# Patient Record
Sex: Female | Born: 1970 | Race: Black or African American | Hispanic: No | Marital: Single | Smoking: Never smoker
Health system: Southern US, Community
[De-identification: ages and names within clinical notes are randomized; demographics above are authoritative.]

---

## 2013-01-24 ENCOUNTER — Emergency Department (HOSPITAL_COMMUNITY)
Admission: EM | Admit: 2013-01-24 | Discharge: 2013-01-24 | Disposition: A | Discharge status: Home / Self Care | Payer: Medicaid Other | Attending: Emergency Medicine | Admitting: Emergency Medicine

## 2013-01-24 ENCOUNTER — Encounter (HOSPITAL_COMMUNITY): Payer: Self-pay | Admitting: Family Medicine

## 2013-01-24 ENCOUNTER — Emergency Department (HOSPITAL_COMMUNITY): Payer: Medicaid Other

## 2013-01-24 DIAGNOSIS — M549 Dorsalgia, unspecified: Secondary | ICD-10-CM | POA: Insufficient documentation

## 2013-01-24 DIAGNOSIS — M542 Cervicalgia: Secondary | ICD-10-CM | POA: Insufficient documentation

## 2013-01-24 DIAGNOSIS — R079 Chest pain, unspecified: Secondary | ICD-10-CM

## 2013-01-24 LAB — POCT I-STAT, CHEM 8
BUN: 10 mg/dL (ref 6–23)
Chloride: 103 mEq/L (ref 96–112)
Creatinine, Ser: 1.1 mg/dL (ref 0.50–1.10)
Potassium: 3.5 mEq/L (ref 3.5–5.1)
Sodium: 138 mEq/L (ref 135–145)

## 2013-01-24 LAB — CBC WITH DIFFERENTIAL/PLATELET
Lymphocytes Relative: 23 % (ref 12–46)
Lymphs Abs: 1.6 10*3/uL (ref 0.7–4.0)
MCV: 81.6 fL (ref 78.0–100.0)
Neutro Abs: 4.7 10*3/uL (ref 1.7–7.7)
Neutrophils Relative %: 69 % (ref 43–77)
Platelets: 262 10*3/uL (ref 150–400)
RBC: 4.51 MIL/uL (ref 3.87–5.11)
WBC: 6.9 10*3/uL (ref 4.0–10.5)

## 2013-01-24 LAB — POCT I-STAT TROPONIN I: Troponin i, poc: 0 ng/mL (ref 0.00–0.08)

## 2013-01-24 LAB — D-DIMER, QUANTITATIVE: D-Dimer, Quant: <0.27 ug{FEU}/mL (ref 0.00–0.48)

## 2013-01-24 NOTE — ED Notes (Signed)
Per pt sts intermittent right sided neck pain, chest pain and upper back pain x 1 month. sts the pain is sharp under her breast. Denies SOB, N, diaphoresis, dizzness.

## 2013-01-24 NOTE — ED Notes (Signed)
NAD noted at time of d/c home 

## 2013-01-24 NOTE — ED Provider Notes (Addendum)
History     CSN: [id number]  Arrival date & time 01/24/13  1436   First MD Initiated Contact with Patient 01/24/13 1457      Chief Complaint  Patient presents with  . Neck Pain  . Chest Pain    (Consider location/radiation/quality/duration/timing/severity/associated sxs/prior treatment) HPI Comments: Patient presents with intermittent pain to her right lateral chest. She describes as a sharp pain that comes and goes. It's been intermittent for the last month. It typically lasts about a minute and subsided. She describes as a sharp pain to her right chest in the mid axilla area which at times radiates behind her scapula. At times she feels some tightness in the center of her chest. Again this typically lasts less than then goes away. It mostly occurs at rest. She has no symptoms on exertion. She has no shortness of breath nausea diaphoresis or dizziness. She denies any leg pain or swelling. She denies any pleuritic-type pain. She denies any VTE risk factors. She denies any history of heart problems in the past. She denies a family history of early heart disease. She also has some tenderness over her right trapezius muscle. She feels like it's stress-induced and tense. She denies any other neck pain. She denies any numbness or weakness in her hands.  Patient is a 42 y.o. female presenting with neck pain and chest pain.  Neck Pain Associated symptoms: chest pain   Associated symptoms: no fever, no headaches, no numbness and no weakness   Chest Pain Associated symptoms: back pain   Associated symptoms: no abdominal pain, no cough, no diaphoresis, no dizziness, no fatigue, no fever, no headache, no nausea, no numbness, no shortness of breath, not vomiting and no weakness     History reviewed. No pertinent past medical history.  Past Surgical History  Procedure Laterality Date  . Cesarean section      History reviewed. No pertinent family history.  History  Substance Use Topics  .  Smoking status: Never Smoker   . Smokeless tobacco: Not on file  . Alcohol Use: No    OB History   Grav Para Term Preterm Abortions TAB SAB Ect Mult Living                  Review of Systems  Constitutional: Negative for fever, chills, diaphoresis and fatigue.  HENT: Positive for neck pain. Negative for congestion, rhinorrhea and sneezing.   Eyes: Negative.   Respiratory: Negative for cough, chest tightness and shortness of breath.   Cardiovascular: Positive for chest pain. Negative for leg swelling.  Gastrointestinal: Negative for nausea, vomiting, abdominal pain, diarrhea and blood in stool.  Genitourinary: Negative for frequency, hematuria, flank pain and difficulty urinating.  Musculoskeletal: Positive for back pain. Negative for arthralgias.  Skin: Negative for rash.  Neurological: Negative for dizziness, speech difficulty, weakness, numbness and headaches.    Allergies  Review of patient's allergies indicates no known allergies.  Home Medications  No current outpatient prescriptions on file.  BP 129/72  Pulse 80  Temp(Src) 98.3 F (36.8 C)  Resp 18  SpO2 100%  LMP 01/18/2013  Physical Exam  Constitutional: She is oriented to person, place, and time. She appears well-developed and well-nourished.  HENT:  Head: Normocephalic and atraumatic.  Eyes: Pupils are equal, round, and reactive to light.  Neck: Normal range of motion. Neck supple.  Cardiovascular: Normal rate, regular rhythm and normal heart sounds.   Pulmonary/Chest: Effort normal and breath sounds normal. No respiratory distress. She has  no wheezes. She has no rales. She exhibits no tenderness.  Abdominal: Soft. Bowel sounds are normal. There is no tenderness. There is no rebound and no guarding.  Musculoskeletal: Normal range of motion. She exhibits no edema.  Lymphadenopathy:    She has no cervical adenopathy.  Neurological: She is alert and oriented to person, place, and time.  Skin: Skin is warm and  dry. No rash noted.  Psychiatric: She has a normal mood and affect.    ED Course  Procedures (including critical care time)  Results for orders placed during the hospital encounter of 01/24/13  D-DIMER, QUANTITATIVE      Result Value Range   D-Dimer, Quant <0.27  0.00 - 0.48 ug/mL-FEU  CBC WITH DIFFERENTIAL      Result Value Range   WBC 6.9  4.0 - 10.5 K/uL   RBC 4.51  3.87 - 5.11 MIL/uL   Hemoglobin 12.8  12.0 - 15.0 g/dL   HCT 16.1  09.6 - 04.5 %   MCV 81.6  78.0 - 100.0 fL   MCH 28.4  26.0 - 34.0 pg   MCHC 34.8  30.0 - 36.0 g/dL   RDW 40.9  81.1 - 91.4 %   Platelets 262  150 - 400 K/uL   Neutrophils Relative 69  43 - 77 %   Neutro Abs 4.7  1.7 - 7.7 K/uL   Lymphocytes Relative 23  12 - 46 %   Lymphs Abs 1.6  0.7 - 4.0 K/uL   Monocytes Relative 7  3 - 12 %   Monocytes Absolute 0.5  0.1 - 1.0 K/uL   Eosinophils Relative 1  0 - 5 %   Eosinophils Absolute 0.1  0.0 - 0.7 K/uL   Basophils Relative 0  0 - 1 %   Basophils Absolute 0.0  0.0 - 0.1 K/uL  POCT I-STAT, CHEM 8      Result Value Range   Sodium 138  135 - 145 mEq/L   Potassium 3.5  3.5 - 5.1 mEq/L   Chloride 103  96 - 112 mEq/L   BUN 10  6 - 23 mg/dL   Creatinine, Ser 7.82  0.50 - 1.10 mg/dL   Glucose, Bld 84  70 - 99 mg/dL   Calcium, Ion 9.56  2.13 - 1.23 mmol/L   TCO2 26  0 - 100 mmol/L   Hemoglobin 13.6  12.0 - 15.0 g/dL   HCT 08.6  57.8 - 46.9 %  POCT I-STAT TROPONIN I      Result Value Range   Troponin i, poc 0.00  0.00 - 0.08 ng/mL   Comment 3            Dg Chest 2 View  01/24/2013  *RADIOLOGY REPORT*  Clinical Data: Chest pain  CHEST - 2 VIEW  Comparison: None.  Findings: Cardiomediastinal silhouette appears normal.  No acute pulmonary disease is noted.  Bony thorax is intact.  IMPRESSION: No acute cardiopulmonary abnormality seen.   Original Report Authenticated By: Lupita Raider.,  M.D.       Date: 01/24/2013  Rate: 78  Rhythm: normal sinus rhythm  QRS Axis: normal  Intervals: normal  ST/T Wave  abnormalities: normal  Conduction Disutrbances:none  Narrative Interpretation:   Old EKG Reviewed: none available   1. Chest pain       MDM  Patient presents with intermittent sharp pains to her right side of her chest. She has no other associated symptoms. Her troponin is negative and her EKG  did not show ischemic changes. I have a low suspicion of acute coronary syndrome. There's nothing else suggestive of pulmonary embolus. I feel this is likely more musculoskeletal in nature. She doesn't have any reflux type symptoms. I gave her some possible resources for outpatient followup. I advised her the importance of close followup if her symptoms are continuing or return here as needed if her symptoms worsen.        Rolan Bucco, MD 01/24/13 1643  Rolan Bucco, MD 01/24/13 [phone]

## 2013-05-04 ENCOUNTER — Emergency Department (HOSPITAL_COMMUNITY)
Admission: EM | Admit: 2013-05-04 | Discharge: 2013-05-05 | Disposition: A | Discharge status: Home / Self Care | Payer: Medicaid Other | Attending: Emergency Medicine | Admitting: Emergency Medicine

## 2013-05-04 ENCOUNTER — Encounter (HOSPITAL_COMMUNITY): Payer: Self-pay | Admitting: *Deleted

## 2013-05-04 DIAGNOSIS — H53149 Visual discomfort, unspecified: Secondary | ICD-10-CM | POA: Insufficient documentation

## 2013-05-04 DIAGNOSIS — H11431 Conjunctival hyperemia, right eye: Secondary | ICD-10-CM

## 2013-05-04 DIAGNOSIS — H109 Unspecified conjunctivitis: Secondary | ICD-10-CM | POA: Insufficient documentation

## 2013-05-04 MED ORDER — TETRACAINE HCL 0.5 % OP SOLN
2.0000 [drp] | Freq: Once | OPHTHALMIC | Status: DC
  Filled 2013-05-04: qty 2

## 2013-05-04 MED ORDER — CIPROFLOXACIN HCL 0.3 % OP SOLN: 1.0000 [drp] | OPHTHALMIC | Status: DC

## 2013-05-04 MED ORDER — FLUORESCEIN SODIUM 1 MG OP STRP
1.0000 | ORAL_STRIP | Freq: Once | OPHTHALMIC | Status: DC
  Filled 2013-05-04: qty 1

## 2013-05-04 NOTE — ED Provider Notes (Signed)
CSN: [id number]     Arrival date & time 05/04/13  1921 History  This chart was scribed for non-physician practitioner, Dierdre Forth, PA-C working with Dagmar Hait, MD by Greggory Stallion, ED scribe. This patient was seen in room TR04C/TR04C and the patient's care was started at 10:00 PM.   Chief Complaint  Patient presents with  . Eye Problem   The history is provided by the patient. No language interpreter was used.    HPI Comments: Jill Hensley is a 42 y.o. female who presents to the Emergency Department complaining of gradual onset, constant right eye redness and irritation with associated photophobia that started yesterday. Pt denies blurred vision, but feels as if she has an eyelash in her eye. She has tried redness instant relief eye drops with no relief. Pt denies eye itching as associated symptoms. She wears contacts on a daily basis.  This pair she has had in for 1.5 weeks and she has not changed them. She denies fever, chills, headache, neck pain, neck stiffness, swelling of her eye or other associated symptoms.    History reviewed. No pertinent past medical history. Past Surgical History  Procedure Laterality Date  . Cesarean section     No family history on file. History  Substance Use Topics  . Smoking status: Never Smoker   . Smokeless tobacco: Not on file  . Alcohol Use: No   OB History   Grav Para Term Preterm Abortions TAB SAB Ect Mult Living                 Review of Systems  Constitutional: Negative for fever, diaphoresis, appetite change, fatigue and unexpected weight change.  HENT: Negative for mouth sores and neck stiffness.   Eyes: Positive for photophobia and redness. Negative for itching and visual disturbance.  Respiratory: Negative for cough, chest tightness, shortness of breath and wheezing.   Cardiovascular: Negative for chest pain.  Gastrointestinal: Negative for nausea, vomiting, abdominal pain, diarrhea and constipation.  Endocrine:  Negative for polydipsia, polyphagia and polyuria.  Genitourinary: Negative for dysuria, urgency, frequency and hematuria.  Musculoskeletal: Negative for back pain.  Skin: Negative for rash.  Allergic/Immunologic: Negative for immunocompromised state.  Neurological: Negative for syncope, light-headedness and headaches.  Hematological: Does not bruise/bleed easily.  Psychiatric/Behavioral: Negative for sleep disturbance. The patient is not nervous/anxious.   All other systems reviewed and are negative.    Allergies  Review of patient's allergies indicates no known allergies.  Home Medications   Current Outpatient Rx  Name  Route  Sig  Dispense  Refill  . naphazoline-glycerin (CLEAR EYES) 0.012-0.2 % SOLN   Right Eye   Place 1-2 drops into the right eye every 3 (three) hours as needed (eye irritation).         . ciprofloxacin (CILOXAN) 0.3 % ophthalmic solution   Right Eye   Place 1 drop into the right eye every 2 (two) hours. Administer 1 drop, every 2 hours, while awake, for 2 days. Then 1 drop, every 4 hours, while awake, for the next 5 days.   5 mL   0     BP 117/73  Pulse 81  Temp(Src) 98.2 F (36.8 C)  Resp 18  SpO2 98%  Physical Exam  Nursing note and vitals reviewed. Constitutional: She appears well-developed and well-nourished. No distress.  Awake, alert, nontoxic appearance  HENT:  Head: Normocephalic and atraumatic.  Right Ear: Hearing, tympanic membrane, external ear and ear canal normal.  Left Ear: Hearing, tympanic membrane,  external ear and ear canal normal.  Nose: Nose normal. No mucosal edema or rhinorrhea.  Mouth/Throat: Uvula is midline, oropharynx is clear and moist and mucous membranes are normal. Mucous membranes are not dry. No oropharyngeal exudate, posterior oropharyngeal edema, posterior oropharyngeal erythema or tonsillar abscesses.  Eyes: EOM and lids are normal. Pupils are equal, round, and reactive to light. No foreign bodies found. Right  eye exhibits no chemosis, no discharge and no exudate. No foreign body present in the right eye. Left eye exhibits no chemosis, no discharge and no exudate. No foreign body present in the left eye. Right conjunctiva is injected. Right conjunctiva has no hemorrhage. Left conjunctiva is not injected. Left conjunctiva has no hemorrhage. No scleral icterus. Right eye exhibits normal extraocular motion. Left eye exhibits normal extraocular motion.  Fundoscopic exam:      The right eye shows no arteriolar narrowing, no AV nicking, no exudate, no hemorrhage and no papilledema.       The left eye shows no arteriolar narrowing, no AV nicking, no exudate, no hemorrhage and no papilledema.  Slit lamp exam:      The right eye shows no corneal abrasion, no corneal flare, no corneal ulcer, no foreign body, no hyphema, no fluorescein uptake and no anterior chamber bulge.       The left eye shows no corneal abrasion, no corneal flare, no corneal ulcer, no foreign body, no hyphema, no fluorescein uptake and no anterior chamber bulge.  Visual Acuity  -   Bilateral Distance:  20/50 ;  R Distance:  20/50 ;  L Distance:  20/50  No corneal abrasion or ulcer noted on slit-lamp exam no fluorescein uptake of the right eye No tenderness to palpation of the right eye  Neck: Normal range of motion. Neck supple.  Cardiovascular: Normal rate, regular rhythm and intact distal pulses.   Pulmonary/Chest: Effort normal and breath sounds normal. No respiratory distress. She has no wheezes.  Abdominal: Soft. Bowel sounds are normal. She exhibits no mass. There is no tenderness. There is no rebound and no guarding.  Musculoskeletal: Normal range of motion. She exhibits no edema.  Neurological: She is alert.  Speech is clear and goal oriented Moves extremities without ataxia  Skin: Skin is warm and dry. She is not diaphoretic.  Psychiatric: She has a normal mood and affect.    ED Course   Procedures (including critical care  time)  DIAGNOSTIC STUDIES: Oxygen Saturation is 98% on RA, normal by my interpretation.    COORDINATION OF CARE: 11:27 PM-Discussed treatment plan which includes talking with Dr. Gwendolyn Grant with pt at bedside and pt agreed to plan. Advised pt to follow up with an opthalmologist.  Labs Reviewed - No data to display No results found. 1. Photophobia   2. Conjunctival injection, right     MDM  Wilson Singer presents with photophobia and eye redness.  Patient presentation consistent with viral conjunctivitis, but concern for infection exists.  No purulent discharge, corneal abrasions, entrapment, consensual photophobia, or dendritic staining with fluorescein study.  Presentation non-concerning for iritis, bacterial conjunctivitis, corneal abrasions, or HSV.  Pt will be d/c with cipro opthalmic for coverage as she is a contact lens wearer.  Personal hygiene and frequent handwashing discussed.  Patient advised to followup with ophthalmologist tomorrow for re-evaluation.  Patient verbalizes understanding and is agreeable with discharge.  I have also discussed reasons to return immediately to the ER.  Patient expresses understanding and agrees with plan.  I personally performed the  services described in this documentation, which was scribed in my presence. The recorded information has been reviewed and is accurate.   Dahlia Client Mabry Tift, PA-C 05/04/13 2355

## 2013-05-04 NOTE — ED Notes (Signed)
Pt states she noticed her rt eye has began to get more red over the past 12hrs. Pt states right now she feels like it may be a lash in her eye and has been rubbing it but her eye does not itch. Pt wears contacts, has them in now. Pt states she wears her contacts for about two months before changing them out. Pts rt eye is visibly red. Pt denies any pain to the eye but states she does have light sensitivity.

## 2013-05-04 NOTE — ED Notes (Signed)
The pt has had a red irritated rt eye since yesterday.  No itching she has blurred  Vision.  She wears contacts and she is wearing

## 2013-05-05 MED ORDER — CIPROFLOXACIN HCL 0.3 % OP SOLN: 1.0000 [drp] | OPHTHALMIC | Status: DC

## 2013-05-05 NOTE — ED Provider Notes (Signed)
Medical screening examination/treatment/procedure(s) were performed by non-physician practitioner and as supervising physician I was immediately available for consultation/collaboration.  Brittnie Lewey Smitty Cords, MD 05/05/13 0010

## 2013-09-07 ENCOUNTER — Emergency Department (HOSPITAL_COMMUNITY)
Admission: EM | Admit: 2013-09-07 | Discharge: 2013-09-08 | Disposition: A | Discharge status: Home / Self Care | Payer: Medicaid Other | Attending: Emergency Medicine | Admitting: Emergency Medicine

## 2013-09-07 ENCOUNTER — Encounter (HOSPITAL_COMMUNITY): Payer: Self-pay | Admitting: Emergency Medicine

## 2013-09-07 DIAGNOSIS — R209 Unspecified disturbances of skin sensation: Secondary | ICD-10-CM | POA: Insufficient documentation

## 2013-09-07 DIAGNOSIS — H9209 Otalgia, unspecified ear: Secondary | ICD-10-CM | POA: Insufficient documentation

## 2013-09-07 DIAGNOSIS — X58XXXA Exposure to other specified factors, initial encounter: Secondary | ICD-10-CM | POA: Insufficient documentation

## 2013-09-07 DIAGNOSIS — R509 Fever, unspecified: Secondary | ICD-10-CM | POA: Insufficient documentation

## 2013-09-07 DIAGNOSIS — Z79899 Other long term (current) drug therapy: Secondary | ICD-10-CM | POA: Insufficient documentation

## 2013-09-07 DIAGNOSIS — S161XXA Strain of muscle, fascia and tendon at neck level, initial encounter: Secondary | ICD-10-CM

## 2013-09-07 DIAGNOSIS — S139XXA Sprain of joints and ligaments of unspecified parts of neck, initial encounter: Secondary | ICD-10-CM | POA: Insufficient documentation

## 2013-09-07 DIAGNOSIS — Y939 Activity, unspecified: Secondary | ICD-10-CM | POA: Insufficient documentation

## 2013-09-07 DIAGNOSIS — R51 Headache: Secondary | ICD-10-CM | POA: Insufficient documentation

## 2013-09-07 DIAGNOSIS — Y929 Unspecified place or not applicable: Secondary | ICD-10-CM | POA: Insufficient documentation

## 2013-09-07 MED ORDER — MELOXICAM 7.5 MG PO TABS: 15.0000 mg | ORAL_TABLET | Freq: Every day | ORAL | Status: DC

## 2013-09-07 MED ORDER — METHOCARBAMOL 500 MG PO TABS: 500.0000 mg | ORAL_TABLET | Freq: Two times a day (BID) | ORAL | Status: AC

## 2013-09-07 MED ORDER — METHOCARBAMOL 500 MG PO TABS: 500.0000 mg | ORAL_TABLET | Freq: Two times a day (BID) | ORAL | Status: DC

## 2013-09-07 NOTE — ED Notes (Signed)
Pt states she has been having a stiff neck for roughly one week, and states she had a fever previously and has been taking Aleve for the fever and pain.

## 2013-09-07 NOTE — ED Notes (Signed)
Pt c/o pain in right side of neck with stiffness.  Also c/o headache off and on.  Fever 2 days ago.

## 2013-09-07 NOTE — ED Provider Notes (Signed)
CSN: [id number]     Arrival date & time 09/07/13  2151 History  This chart was scribed for non-physician practitioner Antony Madura, PA-C working with Sunnie Nielsen, MD by Joaquin Music, ED Scribe. This patient was seen in room TR04C/TR04C and the patient's care was started at 11:30 PM .   Chief Complaint  Patient presents with  . Torticollis   HPI HPI Comments: Jill Hensley is a 42 y.o. female who presents to the Emergency Department complaining of constant neck pain for the past week. She states her neck pain when she turns her head to the R. Pt reports having pressure and discomfort at the base of her head that can cause intermittent HA. She describes her HA as sharp, spontaneous, and fleeting; lasts seconds before spontaneously resolving. She states she has been taking OTC 12-hr Aleve and applying heat to area with relief. She also endorses a subjective tingling in her R arm that is intermittent. Pt denies any previous traumas/injuries to the neck. She states pain worsens when turning to the R and states it can feel stiff at times. Pt denies fever, vision change, difficulty swallowing, ringing of ears, and weakness.  Pt denies having a PCP at this moment but states she usually goes to [facility] to be seen. She states she has not been to [facility] in the past 6 months.   History reviewed. No pertinent past medical history. Past Surgical History  Procedure Laterality Date  . Cesarean section     No family history on file. History  Substance Use Topics  . Smoking status: Never Smoker   . Smokeless tobacco: Not on file  . Alcohol Use: No   OB History   Grav Para Term Preterm Abortions TAB SAB Ect Mult Living   3 3 3  0 0     3     Review of Systems  Constitutional: Positive for fever.  HENT: Positive for ear pain. Negative for trouble swallowing.   Musculoskeletal: Positive for myalgias, neck pain and neck stiffness.  Neurological: Negative for weakness.    Allergies  Review of  patient's allergies indicates no known allergies.  Home Medications   Current Outpatient Rx  Name  Route  Sig  Dispense  Refill  . KETOTIFEN FUMARATE OP   Both Eyes   Place 1 drop into both eyes 3 (three) times daily. 0.035%         . naproxen sodium (ANAPROX) 220 MG tablet   Oral   Take 220 mg by mouth daily as needed (for neck pains).         Bertram Gala Glycol-Propyl Glycol (SYSTANE) 0.4-0.3 % SOLN   Both Eyes   Place 1 drop into both eyes 3 (three) times daily.          Triage Vitals:BP 139/81  Pulse 85  Temp(Src) 97.9 F (36.6 C) (Oral)  Resp 16  SpO2 97%  LMP 09/03/2013  Physical Exam  Nursing note and vitals reviewed. Constitutional: She is oriented to person, place, and time. She appears well-developed and well-nourished. No distress.  HENT:  Head: Normocephalic and atraumatic.  Mouth/Throat: Oropharynx is clear and moist. No oropharyngeal exudate.  Eyes: Conjunctivae and EOM are normal. Pupils are equal, round, and reactive to light. No scleral icterus.  Neck: Normal range of motion. Neck supple.  Normal strength against resistance of neck. No midline TTP of cervical spine. R paraspinal muscle and R SCM TTP appreciated.  Pulmonary/Chest: Effort normal. No respiratory distress.  Musculoskeletal: Normal range of  motion.  Lymphadenopathy:    She has no cervical adenopathy.  Neurological: She is alert and oriented to person, place, and time.  No focal neurologic deficits appreciated. Patient moves extremities without ataxia. No sensory deficits appreciated.  Skin: Skin is warm and dry. No rash noted. She is not diaphoretic. No erythema. No pallor.  Psychiatric: She has a normal mood and affect. Her behavior is normal.    ED Course  Procedures DIAGNOSTIC STUDIES: Oxygen Saturation is 97% on RA, normal by my interpretation.    COORDINATION OF CARE: 11:40 PM-Discussed treatment plan which includes D/C pt with Mobic and Robaxin. Advised pt to apply ice to  the area. Pt agreed to plan.   Labs Review Labs Reviewed - No data to display Imaging Review No results found.  EKG Interpretation   None       MDM   1. Neck strain, initial encounter    Uncomplicated neck strain in this otherwise healthy 42 year old female. No focal neurologic deficits appreciated on physical exam. Patient moves extremities without ataxia. No nuchal rigidity or meningismus. Patient well and nontoxic appearing, hemodynamically stable, and afebrile. She denies trauma or injury to her neck as well as IV drug use or history of cancer. Patient stable and appropriate for discharge with prescriptions for Mobic and Robaxin. Return precautions discussed patient agreeable to plan with no unaddressed concerns.  I personally performed the services described in this documentation, which was scribed in my presence. The recorded information has been reviewed and is accurate.     Antony Madura, PA-C 09/10/13 [phone]

## 2013-09-17 NOTE — ED Provider Notes (Signed)
Medical screening examination/treatment/procedure(s) were performed by non-physician practitioner and as supervising physician I was immediately available for consultation/collaboration.   Sunnie Nielsen, MD 09/17/13 2120

## 2013-10-05 IMAGING — CR DG CHEST 2V
2 series · 2 of 2 positions shown · non-contrast
Comparison: None.

CLINICAL DATA: Chest pain

CHEST - 2 VIEW

[w chest pa]
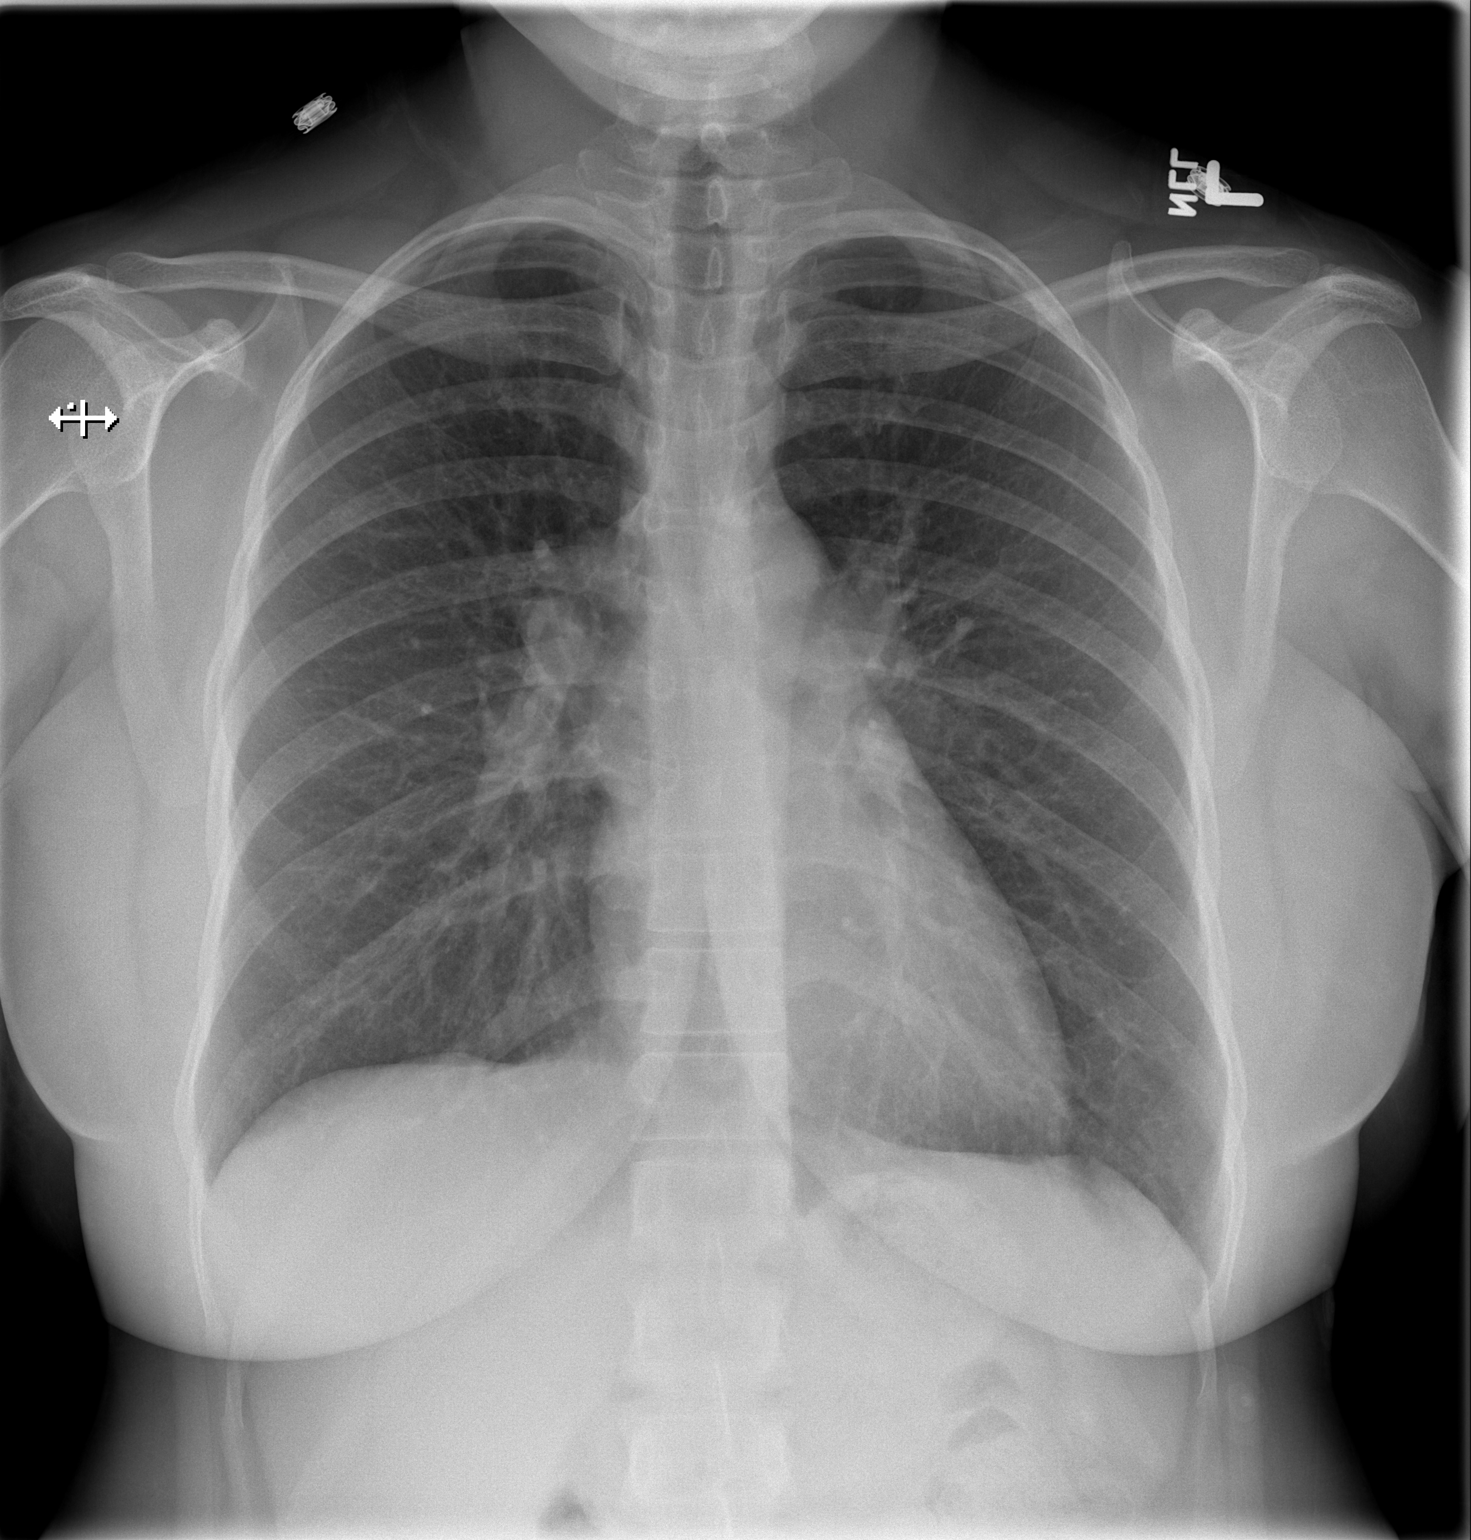

[w chest lat]
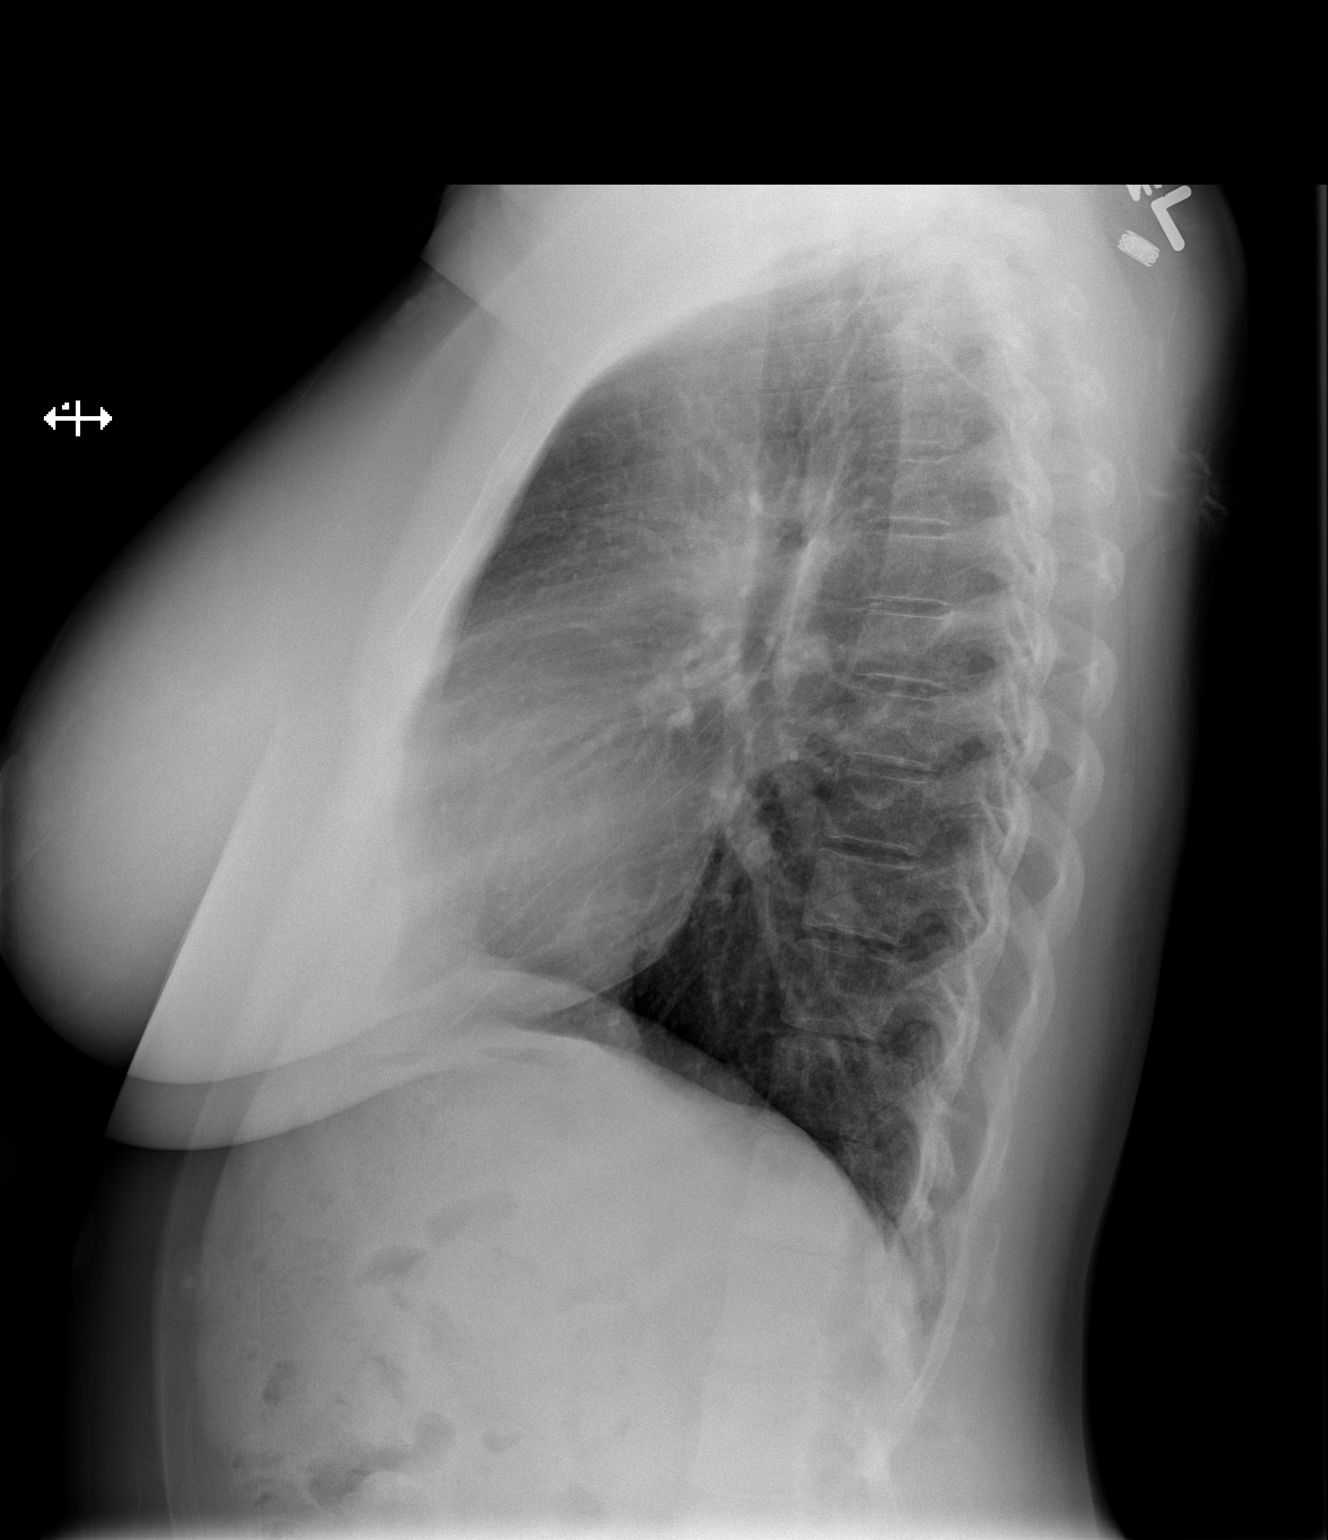

[2 of 2 positions shown; findings below may reference images not displayed]

FINDINGS: Cardiomediastinal silhouette appears normal.  No acute
pulmonary disease is noted.  Bony thorax is intact.
IMPRESSION: No acute cardiopulmonary abnormality seen.

## 2014-07-25 ENCOUNTER — Encounter (HOSPITAL_COMMUNITY): Payer: Self-pay | Admitting: Emergency Medicine

## 2018-02-24 ENCOUNTER — Other Ambulatory Visit: Payer: Self-pay | Admitting: Nurse Practitioner

## 2018-02-24 DIAGNOSIS — Z1231 Encounter for screening mammogram for malignant neoplasm of breast: Secondary | ICD-10-CM

## 2020-01-24 ENCOUNTER — Ambulatory Visit (INDEPENDENT_AMBULATORY_CARE_PROVIDER_SITE_OTHER): Payer: Managed Care, Other (non HMO)

## 2020-01-24 ENCOUNTER — Ambulatory Visit (INDEPENDENT_AMBULATORY_CARE_PROVIDER_SITE_OTHER): Payer: Managed Care, Other (non HMO) | Admitting: Podiatry

## 2020-01-24 ENCOUNTER — Other Ambulatory Visit: Payer: Self-pay | Admitting: Podiatry

## 2020-01-24 ENCOUNTER — Other Ambulatory Visit: Payer: Self-pay

## 2020-01-24 DIAGNOSIS — M7752 Other enthesopathy of left foot: Secondary | ICD-10-CM

## 2020-01-24 DIAGNOSIS — M7751 Other enthesopathy of right foot: Secondary | ICD-10-CM

## 2020-01-24 DIAGNOSIS — M79671 Pain in right foot: Secondary | ICD-10-CM

## 2020-01-24 MED ORDER — MELOXICAM 15 MG PO TABS: 15.0000 mg | ORAL_TABLET | Freq: Every day | ORAL | 1 refills | Status: DC

## 2020-01-26 NOTE — Progress Notes (Signed)
   HPI: 49 y.o. female presenting today as a new patient with a chief complaint of bilateral foot pain, left worse than right, that began 1.5 weeks ago. She reports associated swelling and tingling sensation. Walking increases the pain. She has been wearing Crocs and soaking the feet in Epsom salt for treatment. Patient is here for further evaluation and treatment.   No past medical history on file.   Physical Exam: General: The patient is alert and oriented x3 in no acute distress.  Dermatology: Skin is warm, dry and supple bilateral lower extremities. Negative for open lesions or macerations.  Vascular: Palpable pedal pulses bilaterally. No edema or erythema noted. Capillary refill within normal limits.  Neurological: Epicritic and protective threshold grossly intact bilaterally.   Musculoskeletal Exam: Pain with palpation noted to the 5th MPJ of the left foot. Range of motion within normal limits to all pedal and ankle joints bilateral. Muscle strength 5/5 in all groups bilateral.   Radiographic Exam:  Stable, intact hardware noted to left ankle and 1st metatarsals bilaterally. Normal osseous mineralization. Joint spaces preserved. No fracture/dislocation/boney destruction.    Assessment: 1. 5th MPJ capsulitis left  2. H/o bunionectomies bilateral 3. H/o ORIF left ankle    Plan of Care:  1. Patient evaluated. X-Rays reviewed.  2. Injection of 0.5 mLs Celestone Soluspan injected into the 5th MPJ of the left foot.  3. Post op shoe dispensed.  4. Prescription for Meloxicam provided to patient. 5. Return to clinic in 4 weeks.      Felecia Shelling, DPM [facility]  Dr. Felecia Shelling, DPM    [address] [place], [place] [address]                Office [phone]  Fax [phone]

## 2020-02-28 ENCOUNTER — Encounter: Payer: Self-pay | Admitting: Podiatry

## 2020-02-28 ENCOUNTER — Other Ambulatory Visit: Payer: Self-pay

## 2020-02-28 ENCOUNTER — Ambulatory Visit (INDEPENDENT_AMBULATORY_CARE_PROVIDER_SITE_OTHER): Payer: Managed Care, Other (non HMO) | Admitting: Podiatry

## 2020-02-28 DIAGNOSIS — M7752 Other enthesopathy of left foot: Secondary | ICD-10-CM

## 2020-02-28 MED ORDER — MELOXICAM 15 MG PO TABS: 15.0000 mg | ORAL_TABLET | Freq: Every day | ORAL | 1 refills | Status: AC

## 2020-02-28 NOTE — Progress Notes (Signed)
   HPI: 49 y.o. female presenting today for follow-up evaluation regarding fifth MTPJ pain left foot.  Patient states she is doing much better.  She states that the injection, the surgical shoe, and the meloxicam helped significantly.  She is currently not having any significant pain.  No new complaints at this time.  No past medical history on file.   Physical Exam: General: The patient is alert and oriented x3 in no acute distress.  Dermatology: Skin is warm, dry and supple bilateral lower extremities. Negative for open lesions or macerations.  Vascular: Palpable pedal pulses bilaterally. No edema or erythema noted. Capillary refill within normal limits.  Neurological: Epicritic and protective threshold grossly intact bilaterally.   Musculoskeletal Exam: Negative for pain with palpation noted to the 5th MPJ of the left foot. Range of motion within normal limits to all pedal and ankle joints bilateral. Muscle strength 5/5 in all groups bilateral.   Assessment: 1. 5th MPJ capsulitis left  2. H/o bunionectomies bilateral 3. H/o ORIF left ankle    Plan of Care:  1. Patient evaluated.   2.  Recommend transitioning out of the postoperative shoe into good supportive tennis shoes for 1 month. 3.  Continue meloxicam as needed.  Refill sent to the pharmacy 4.  Stressed the importance of not going barefoot for the next month or so  5.  Return to clinic as needed   Felecia Shelling, DPM [facility]  Dr. Felecia Shelling, DPM    [address] [place], [place] [address]                Office [phone]  Fax [phone]
